# Patient Record
Sex: Male | Born: 1985 | Race: White | Hispanic: No | Marital: Single | Smoking: Current every day smoker
Health system: Southern US, Community
[De-identification: ages and names within clinical notes are randomized; demographics above are authoritative.]

## PROBLEM LIST (undated history)

## (undated) HISTORY — PX: COLONOSCOPY: SHX174

## (undated) HISTORY — PX: UPPER GASTROINTESTINAL ENDOSCOPY: SHX188

## (undated) HISTORY — PX: BACK SURGERY: SHX140

---
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
## 2007-08-10 ENCOUNTER — Emergency Department (HOSPITAL_COMMUNITY): Admission: EM | Admit: 2007-08-10 | Discharge: 2007-08-10 | Payer: Self-pay | Admitting: Family Medicine

## 2011-02-20 NOTE — Op Note (Signed)
NAME:  Steven Fuentes, HACKENBERG NO.:  [id number]   MEDICAL RECORD NO.:  [id number]                   PATIENT TYPE:  OIB   LOCATION:  2895                                 FACILITY:  [facility]   PHYSICIAN:  Hewitt Shorts, M.D.            DATE OF BIRTH:  1986-01-25   DATE OF PROCEDURE:  03/26/2004  DATE OF DISCHARGE:                                 OPERATIVE REPORT   PREOPERATIVE DIAGNOSIS:  Left L5-S1 lumbar disk herniation, lumbar  degenerative disk disease, lumbar spondylosis and lumbar radiculopathy.   POSTOPERATIVE DIAGNOSIS:  Left L5-S1 lumbar disk herniation, lumbar  degenerative disk disease, lumbar spondylosis and lumbar radiculopathy.   OPERATION PERFORMED:  Left L5-S1 lumbar laminotomy and microdiskectomy with  microdissection.   SURGEON:  Hewitt Shorts, M.D.   ASSISTANT:  Danae Orleans. Venetia Maxon, M.D.   ANESTHESIA:  General endotracheal.   INDICATIONS FOR PROCEDURE:  The patient is an 25 year old man who injured  his back while lifting weights.  Suffered a left L5-S1 disk herniation.  There was underlying degenerative disk disease and spondylosis.  A decision  was made to proceed with elective laminotomy and microdiskectomy.   DESCRIPTION OF PROCEDURE:  The patient was brought to the operating room and  placed under general endotracheal anesthesia.  The patient was turned to a  prone position.  Lumbar region was prepped with Betadine soap and solution  and draped in sterile fashion.  The midline was infiltrated with local  anesthetic with epinephrine.  X-ray was taken to localize the L5-S1 level  and then a midline incision was made over the L5-S1 level and carried down  to the subcutaneous tissue.  Bipolar cautery and electrocautery were used to  maintain hemostasis.  Dissection was carried down to the lumbar fascia which  was incised on the left side of the midline in the paraspinal muscles.  We  dissected the spinous process and lamina in  subperiosteal fashion.  The L5-  S1 interlaminar space was identified and x-ray was taken to confirm the  localization and then the operating microscope was draped and brought into  the field to provide additional magnification, illumination and  visualization and the remainder of the decompression was performed using  microdissection and microsurgical technique. A laminotomy was performed  using the [facility] Max drill.  Edges of bone were waxed with bone wax.  The  ligamentum flavum was carefully resected and we identified the thecal sac  and left S1 nerve root.  These structures were gently retracted medially.  Epidural veins were coagulated and divided as necessary.  We were able to  identify the disk herniation.  The remaining overlying annular fibers were  incised and disk herniation removed.  Anterior to the disk space we removed  additional degenerative disk material and in the end, all loose fragments of  disk material were removed from both the disk space and the epidural space  and good  decompression of the thecal sac and nerve root was achieved.  Once  the decompression was completed, hemostasis was established with the use of  bipolar cautery as well as Gelfoam soaked in thrombin.  All the Gelfoam was  removed prior to closure.  The wound was irrigated with bacitracin solution  and checked once again for hemostasis, then we instilled 2 mL of fentanyl  and 80 mg of Depo-Medrol into the epidural space and proceeded with closure.  The deep fascia was closed with #1 undyed Vicryl sutures, the subcutaneous  and subcuticular layer were closed with interrupted inverted 2-0 undyed  Vicryl sutures and skin edges closed with  Dermabond.  The patient tolerated the procedure well.  The estimated blood  loss for this procedure was 50 mL.  Sponge, needle and instrument counts  were correct.  Following surgery the patient was turned back to supine  position to be reversed from anesthetic,  extubated and transferred to  recovery room for further care.                                               Hewitt Shorts, M.D.    RWN/MEDQ  D:  03/26/2004  T:  03/27/2004  Job:  [id number]

## 2011-07-14 LAB — POCT RAPID STREP A: Streptococcus, Group A Screen (Direct): NEGATIVE

## 2015-05-22 ENCOUNTER — Emergency Department (HOSPITAL_COMMUNITY)
Admission: EM | Admit: 2015-05-22 | Discharge: 2015-05-22 | Disposition: A | Discharge status: Home / Self Care | Payer: Worker's Compensation | Attending: Emergency Medicine | Admitting: Emergency Medicine

## 2015-05-22 ENCOUNTER — Encounter (HOSPITAL_COMMUNITY): Payer: Self-pay | Admitting: *Deleted

## 2015-05-22 DIAGNOSIS — Y9389 Activity, other specified: Secondary | ICD-10-CM | POA: Diagnosis not present

## 2015-05-22 DIAGNOSIS — Y9289 Other specified places as the place of occurrence of the external cause: Secondary | ICD-10-CM | POA: Diagnosis not present

## 2015-05-22 DIAGNOSIS — W868XXA Exposure to other electric current, initial encounter: Secondary | ICD-10-CM | POA: Insufficient documentation

## 2015-05-22 DIAGNOSIS — Y990 Civilian activity done for income or pay: Secondary | ICD-10-CM | POA: Diagnosis not present

## 2015-05-22 DIAGNOSIS — T754XXA Electrocution, initial encounter: Secondary | ICD-10-CM | POA: Diagnosis not present

## 2015-05-22 DIAGNOSIS — Z720 Tobacco use: Secondary | ICD-10-CM | POA: Insufficient documentation

## 2015-05-22 DIAGNOSIS — T148 Other injury of unspecified body region: Secondary | ICD-10-CM | POA: Insufficient documentation

## 2015-05-22 LAB — CBC WITH DIFFERENTIAL/PLATELET
Basophils Absolute: 0 10*3/uL (ref 0.0–0.1)
Basophils Relative: 0 % (ref 0–1)
EOS PCT: 1 % (ref 0–5)
Eosinophils Absolute: 0.1 10*3/uL (ref 0.0–0.7)
HCT: 44.5 % (ref 39.0–52.0)
Hemoglobin: 15.3 g/dL (ref 13.0–17.0)
LYMPHS ABS: 3.1 10*3/uL (ref 0.7–4.0)
LYMPHS PCT: 31 % (ref 12–46)
MCH: 29.9 pg (ref 26.0–34.0)
MCHC: 34.4 g/dL (ref 30.0–36.0)
MCV: 86.9 fL (ref 78.0–100.0)
MONO ABS: 0.7 10*3/uL (ref 0.1–1.0)
MONOS PCT: 7 % (ref 3–12)
Neutro Abs: 6.1 10*3/uL (ref 1.7–7.7)
Neutrophils Relative %: 61 % (ref 43–77)
PLATELETS: 217 10*3/uL (ref 150–400)
RBC: 5.12 MIL/uL (ref 4.22–5.81)
RDW: 12.2 % (ref 11.5–15.5)
WBC: 10 10*3/uL (ref 4.0–10.5)

## 2015-05-22 LAB — CK: Total CK: 205 U/L (ref 49–397)

## 2015-05-22 LAB — CK TOTAL AND CKMB (NOT AT ARMC)
CK, MB: 7 ng/mL — ABNORMAL HIGH (ref 0.5–5.0)
Relative Index: 3.5 — ABNORMAL HIGH (ref 0.0–2.5)
Total CK: 200 U/L (ref 49–397)

## 2015-05-22 LAB — BASIC METABOLIC PANEL
Anion gap: 9 (ref 5–15)
BUN: 14 mg/dL (ref 6–20)
CHLORIDE: 108 mmol/L (ref 101–111)
CO2: 23 mmol/L (ref 22–32)
Calcium: 9.3 mg/dL (ref 8.9–10.3)
Creatinine, Ser: 1.23 mg/dL (ref 0.61–1.24)
GFR calc Af Amer: >60 mL/min (ref >60)
GFR calc non Af Amer: >60 mL/min (ref >60)
GLUCOSE: 111 mg/dL — AB (ref 65–99)
POTASSIUM: 3.7 mmol/L (ref 3.5–5.1)
Sodium: 140 mmol/L (ref 135–145)

## 2015-05-22 MED ORDER — CYCLOBENZAPRINE HCL 10 MG PO TABS
ORAL_TABLET | ORAL | Status: AC
  Filled 2015-05-22: qty 1

## 2015-05-22 MED ORDER — HYDROCODONE-ACETAMINOPHEN 5-325 MG PO TABS: 1.0000 | ORAL_TABLET | ORAL | Status: DC | PRN

## 2015-05-22 MED ORDER — CYCLOBENZAPRINE HCL 10 MG PO TABS
10.0000 mg | ORAL_TABLET | Freq: Once | ORAL | Status: AC
  Administered 2015-05-22: 10 mg via ORAL

## 2015-05-22 MED ORDER — HYDROCODONE-ACETAMINOPHEN 5-325 MG PO TABS
1.0000 | ORAL_TABLET | Freq: Once | ORAL | Status: AC
  Administered 2015-05-22: 1 via ORAL

## 2015-05-22 MED ORDER — HYDROCODONE-ACETAMINOPHEN 5-325 MG PO TABS
ORAL_TABLET | ORAL | Status: AC
  Filled 2015-05-22: qty 2

## 2015-05-22 MED ORDER — CYCLOBENZAPRINE HCL 10 MG PO TABS: 10.0000 mg | ORAL_TABLET | Freq: Three times a day (TID) | ORAL | Status: DC | PRN
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
## 2015-05-22 NOTE — Discharge Instructions (Signed)
Electric Shock Injury Electric shock injuries may be caused by lightning or electricity (current) passing through the body. The amount of injury depends on the current's pressure (voltage), the amount of current (amperage), the type of current (direct vs. alternating), the body's resistance to the current, the current's path through the body, and how long the body remains in contact with the current. Current is the flow of electricity. Electricity may produce effects ranging from barely noticeable tingling to instant death; every part of the body is vulnerable.  The harshness of injury depends mostly on the voltage. Low voltage can be as dangerous as high voltage under the right circumstances. People have been killed by shocks of just 50 volts. WHAT DETERMINES THE EFFECTS OF ELECTRICITY? How electric shocks affect the skin is determined by the skin's resistance. This is the skin's ability to stay unharmed by a shock. This, in turn, depends upon the wetness, dryness, thickness and or cleanliness of the skin. Thin or wet skin is much less resistant than thick or dry skin. When skin resistance is low, the current may cause little or no skin damage but may severely burn internal organs and tissues. Conversely, high skin resistance, such as with dry thick skin, can produce severe skin burns but decreases the current entering the body. WHAT PARTS OF THE BODY DOES ELECTRICITY AFFECT THE MOST?  The nervous system (the brain, spinal cord, and nerves) are most helpless to the effects of electricity and most often harmed in electrical injury. Some damage is minor and clears up on its own or with treatment. Sometimes the damage is severe and will be permanent. Neurological problems may be apparent immediately after the accident, or gradually develop over a period of up to three years.  Damage to the respiratory and cardiovascular systems happens immediately. Electric shocks can paralyze the respiratory system (stop  breathing) or disrupt heart action (cause the heart to beat irregularly or stop). This may cause instant death. Smaller veins and arteries, which get hot more easily than the larger blood vessels, are at greater risk. They can develop blood clots. Damage to the smaller vessels is a common cause of amputation following high-voltage injuries.  Other injuries may include cataracts, kidney failure, and injury to muscle tissue. An electric arc may set clothing and flammable substances on fire which may cause burns. Strong shocks are often accompanied by violent muscle spasms that can break and dislocate bones. These spasms can also freeze the victim in place and prevent him or her from breaking away from the current. DIAGNOSIS  Diagnosis relies on information about the cause of the accident, physical examination, and close monitoring of the heart, lungs, neurological condition and kidney activity. These conditions can change rapidly so close observation is necessary. Magnetic resonance imaging (MRI) may be necessary to check for brain injury. TREATMENT   When an electrical accident happens at home or in the workplace, emergency medical help should be summoned as quickly as possible. The main power should immediately be shut off. If that cannot be done, and current is still flowing through the victim, stand on a dry, non-conducting surface such as a folded newspaper, flattened cardboard carton, or plastic or rubber mat. Use a non-conducting object such as a wooden broomstick (never a damp or metallic object) to push the victim away from the source of the current. Non-conducting means the substance will not pass electricity easily through it. Do not touch the victim or electrical source while the current is still flowing. This  may electrocute the rescuer.  If the victim is faint, pale, or showing signs of shock, lay the victim down, with the legs elevated above the level of the chest. Warm the person with a  blanket.  If a pulse can not be felt, or the person is not breathing, someone trained in cardiopulmonary resuscitation (CPR) should begin CPR. Continue this until help arrives.  If the victim is burned, remove clothing that comes off easily. Rinse the burned area in cool water for pain relief. Give first aid for burns. Burns often require treatment at a burn center.  Electrical injury can be associated with explosions or falls that can cause other injuries. Avoid moving the head or neck if an injury to this area is suspected.  Give first aid as needed for other wounds or fractures.  Fluid replacement therapy is necessary to restore lost fluids and electrolytes. Severely injured tissue is repaired surgically.  Antibiotics and antibacterial creams are used to prevent infection.  Kidney failure may need to be treated.  Physical therapy may help recovery along with counseling if there is disfigurement. PROGNOSIS   Electric shocks may cause death.  Survivors may require amputation. Cosmetic problems may result along with disfigurement.  Injuries from household appliances and other low-voltage sources are less likely to produce extreme damage. PREVENTION   Know electrical dangers in your home.  Damaged electric appliances, wiring, cords, and plugs should be repaired or replaced. Electrical repairs should be attempted only by people with the proper training.  Hair dryers, radios, and other electric appliances should never be used in the bathroom or anywhere else they might accidentally come in contact with water. Water and pipes create a ground and the electricity picks the easiest way to go to ground which can be through your body.  Young children need to be kept away from electric appliances and should be taught about the dangers of electricity as soon as they are old enough.  Electric outlets require safety covers in homes with young children.  During lightning and thunder storms, go  indoors immediately, even if no rain is falling. Boaters should return to shore as rapidly as possible.  If the hair on your head or arms stands on end during a storm, seek cover as rapidly as possible as a lightning strike may be about to happen.  If you cannot reach indoor shelter, stay away from metallic objects such as golf clubs or fishing rods and lie down in low-ground areas. Standing or lying under or next to tall or metallic structures is unsafe. For example, it is unsafe to stand under a tree during a lightning storm. Do not stand next to long conductors of electricity such as wire fences.  An automobile is appropriate cover, as long as the radio is off.  Telephones, computers, hair dryers, and other appliances that can act as channels for lightning should not be used during a thunder storm.  During storms, stay away from screens and metal that may conduct electricity from the outside. SEEK IMMEDIATE MEDICAL CARE IF:  You develop chest pain.  A part of your arms or legs becomes very swollen or painful.  One of your arms or legs appears pale, cool, or discolored.  Your urine becomes discolored, or you are not urinating as much as usual.  You develop severe abdominal pain. Document Released: 09/24/2003 Document Revised: 12/14/2011 Document Reviewed: 12/18/2013 [facility] Patient Information 2015 [place], [place]. This information is not intended to replace advice given to you by  your health care provider. Make sure you discuss any questions you have with your health care provider. ° °

## 2016-12-27 ENCOUNTER — Emergency Department (HOSPITAL_COMMUNITY): Payer: Self-pay

## 2016-12-27 ENCOUNTER — Emergency Department (HOSPITAL_COMMUNITY)
Admission: EM | Admit: 2016-12-27 | Discharge: 2016-12-27 | Disposition: A | Discharge status: Home / Self Care | Payer: Self-pay | Attending: Emergency Medicine | Admitting: Emergency Medicine

## 2016-12-27 ENCOUNTER — Encounter (HOSPITAL_COMMUNITY): Payer: Self-pay | Admitting: Emergency Medicine

## 2016-12-27 DIAGNOSIS — Y999 Unspecified external cause status: Secondary | ICD-10-CM | POA: Insufficient documentation

## 2016-12-27 DIAGNOSIS — Y929 Unspecified place or not applicable: Secondary | ICD-10-CM | POA: Insufficient documentation

## 2016-12-27 DIAGNOSIS — S0993XA Unspecified injury of face, initial encounter: Secondary | ICD-10-CM | POA: Insufficient documentation

## 2016-12-27 DIAGNOSIS — S0992XA Unspecified injury of nose, initial encounter: Secondary | ICD-10-CM | POA: Insufficient documentation

## 2016-12-27 DIAGNOSIS — S0990XA Unspecified injury of head, initial encounter: Secondary | ICD-10-CM

## 2016-12-27 DIAGNOSIS — F17210 Nicotine dependence, cigarettes, uncomplicated: Secondary | ICD-10-CM | POA: Insufficient documentation

## 2016-12-27 DIAGNOSIS — Y939 Activity, unspecified: Secondary | ICD-10-CM | POA: Insufficient documentation

## 2016-12-27 MED ORDER — IBUPROFEN 600 MG PO TABS: 600.0000 mg | ORAL_TABLET | Freq: Four times a day (QID) | ORAL | 0 refills | Status: DC | PRN

## 2016-12-27 MED ORDER — HYDROCODONE-ACETAMINOPHEN 5-325 MG PO TABS
1.0000 | ORAL_TABLET | Freq: Once | ORAL | Status: AC
  Administered 2016-12-27: 1 via ORAL
  Filled 2016-12-27: qty 1

## 2016-12-27 NOTE — ED Notes (Signed)
Pt refusing BP cuff. Made aware that we will need to take vitals every 2 hours.
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
## 2016-12-27 NOTE — ED Notes (Signed)
Pt understood dc material. NAD noted. Scripts given at dc 

## 2016-12-27 NOTE — Discharge Instructions (Addendum)
There were no signs of fracture on the nasal xray.  Head injury: You have sustained a head injury and may have sustained a concussion. Please refer to the attached documents for information regarding expected symptoms and symptoms that would require you to return to the ED. Pain: May take ibuprofen or naproxen to reduce pain and inflammation. Take these types of medications with food to avoid upset stomach. Choose one of these medications, but not both.  Ice: May apply ice to the area over the next 24 hours for 15 minutes at a time to reduce swelling. Nosebleed: Refer to the attached document for more information. Follow up: Follow up with a primary care provider for any future management.
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
## 2019-05-27 ENCOUNTER — Encounter (HOSPITAL_COMMUNITY): Payer: Self-pay | Admitting: Emergency Medicine

## 2019-05-27 ENCOUNTER — Other Ambulatory Visit: Payer: Self-pay

## 2019-05-27 ENCOUNTER — Emergency Department (HOSPITAL_COMMUNITY)
Admission: EM | Admit: 2019-05-27 | Discharge: 2019-05-28 | Disposition: A | Discharge status: Home / Self Care | Payer: No Typology Code available for payment source | Attending: Emergency Medicine | Admitting: Emergency Medicine

## 2019-05-27 ENCOUNTER — Emergency Department (HOSPITAL_COMMUNITY): Payer: No Typology Code available for payment source

## 2019-05-27 DIAGNOSIS — Y929 Unspecified place or not applicable: Secondary | ICD-10-CM | POA: Diagnosis not present

## 2019-05-27 DIAGNOSIS — Y939 Activity, unspecified: Secondary | ICD-10-CM | POA: Diagnosis not present

## 2019-05-27 DIAGNOSIS — X500XXA Overexertion from strenuous movement or load, initial encounter: Secondary | ICD-10-CM | POA: Diagnosis not present

## 2019-05-27 DIAGNOSIS — Y9301 Activity, walking, marching and hiking: Secondary | ICD-10-CM | POA: Insufficient documentation

## 2019-05-27 DIAGNOSIS — S99911A Unspecified injury of right ankle, initial encounter: Secondary | ICD-10-CM | POA: Diagnosis present

## 2019-05-27 DIAGNOSIS — Y999 Unspecified external cause status: Secondary | ICD-10-CM | POA: Insufficient documentation

## 2019-05-27 DIAGNOSIS — S93401A Sprain of unspecified ligament of right ankle, initial encounter: Secondary | ICD-10-CM | POA: Insufficient documentation

## 2019-05-27 NOTE — ED Triage Notes (Signed)
Pt reported he fell while walking down stairs carrying glasses. Pt reported right foot bent inward, ankle now swollen. Also reported hearing popping noise.

## 2019-05-27 NOTE — ED Notes (Signed)
ED Provider at bedside. 

## 2019-05-27 NOTE — ED Provider Notes (Signed)
[facility] EMERGENCY DEPARTMENT Provider Note   CSN: [id number] Arrival date & time: 05/27/19  2324     History   Chief Complaint Chief Complaint  Patient presents with  . Ankle Pain  . Fall    HPI Steven Fuentes is a 33 y.o. male.     The history is provided by the patient. No language interpreter was used.  Ankle Pain Location:  Ankle Time since incident:  2 hours Injury: yes   Mechanism of injury comment:  Rolled ankle while walking downstairs Ankle location:  R ankle Pain details:    Severity:  Moderate   Onset quality:  Sudden   Timing:  Constant   Progression:  Unchanged Chronicity:  New Foreign body present:  No foreign bodies Prior injury to area:  Yes (prior strain/sprain) Relieved by:  Nothing Worsened by:  Bearing weight Ineffective treatments:  Rest Associated symptoms: swelling   Associated symptoms: no muscle weakness, no numbness and no tingling   Risk factors: no known bone disorder   Fall    History reviewed. No pertinent past medical history.  There are no active problems to display for this patient.   Past Surgical History:  Procedure Laterality Date  . BACK SURGERY         Home Medications    Prior to Admission medications   Medication Sig Start Date End Date Taking? Authorizing Provider  naproxen (NAPROSYN) 500 MG tablet Take 1 tablet (500 mg total) by mouth every 12 (twelve) hours as needed for mild pain or moderate pain. 05/28/19   Antonietta Breach, PA-C    Family History No family history on file.  Social History Social History   Tobacco Use  . Smoking status: Current Every Day Smoker    Types: Cigarettes  Substance Use Topics  . Alcohol use: No  . Drug use: No     Allergies   Patient has no known allergies.   Review of Systems Review of Systems Ten systems reviewed and are negative for acute change, except as noted in the HPI.    Physical Exam Updated Vital Signs BP 121/90 (BP Location:  Left Arm)   Pulse 91   Temp 98.4 F (36.9 C) (Oral)   Resp 19   SpO2 100%   Physical Exam Vitals signs and nursing note reviewed.  Constitutional:      General: He is not in acute distress.    Appearance: He is well-developed. He is not diaphoretic.     Comments: Nontoxic appearing and in NAD  HENT:     Head: Normocephalic and atraumatic.  Eyes:     General: No scleral icterus.    Conjunctiva/sclera: Conjunctivae normal.  Neck:     Musculoskeletal: Normal range of motion.  Cardiovascular:     Rate and Rhythm: Normal rate and regular rhythm.     Pulses: Normal pulses.     Comments: DP and PT pulse 2+ in the RLE Pulmonary:     Effort: Pulmonary effort is normal. No respiratory distress.  Musculoskeletal:     Comments: Mild soft tissues swelling about the lateral malleolus without crepitus or deformity; TTP noted. Negative Thompson's test.   Skin:    General: Skin is warm and dry.     Coloration: Skin is not pale.     Findings: No erythema or rash.  Neurological:     Mental Status: He is alert and oriented to person, place, and time.     Comments: Sensation to light  touch intact in the RLE. Patient able to wiggle all toes.  Psychiatric:        Behavior: Behavior normal.      ED Treatments / Results  Labs (all labs ordered are listed, but only abnormal results are displayed) Labs Reviewed - No data to display  EKG None  Radiology Dg Ankle Complete Right  Result Date: 05/28/2019 CLINICAL DATA:  Fall while walking down stairs carrying glasses. Right ankle pain and swelling. EXAM: RIGHT ANKLE - COMPLETE 3+ VIEW COMPARISON:  None. FINDINGS: There is no evidence of fracture, dislocation, or joint effusion. There is no evidence of arthropathy or other focal bone abnormality. Mild soft tissue edema. IMPRESSION: Mild soft tissue edema. No acute osseous abnormality. Electronically Signed   By: Keith Rake M.D.   On: 05/28/2019 00:07    Procedures Procedures (including  critical care time)  Medications Ordered in ED Medications - No data to display   Initial Impression / Assessment and Plan / ED Course  I have reviewed the triage vital signs and the nursing notes.  Pertinent labs & imaging results that were available during my care of the patient were reviewed by me and considered in my medical decision making (see chart for details).        Patient presents to the emergency department for evaluation of R ankle pain. Patient neurovascularly intact on exam. Imaging negative for fracture, dislocation, bony deformity. No erythema, heat to touch to the affected area; no concern for septic joint. Compartments in the affected extremity are soft. Plan for supportive management including RICE and NSAIDs; primary care follow up as needed. Return precautions discussed and provided. Patient discharged in stable condition with no unaddressed concerns.   Final Clinical Impressions(s) / ED Diagnoses   Final diagnoses:  Sprain of right ankle, unspecified ligament, initial encounter    ED Discharge Orders         Ordered    naproxen (NAPROSYN) 500 MG tablet  Every 12 hours PRN     05/28/19 0014           Antonietta Breach, PA-C 05/28/19 0023    Veryl Speak, MD 05/28/19 [phone]

## 2019-05-27 NOTE — ED Notes (Signed)
Patient transported to X-ray 

## 2019-05-28 MED ORDER — NAPROXEN 500 MG PO TABS: 500.0000 mg | ORAL_TABLET | Freq: Two times a day (BID) | ORAL | 0 refills | Status: AC | PRN

## 2019-05-28 NOTE — Discharge Instructions (Signed)
Wear a brace for stability.  You may use crutches to prevent from putting weight on your right ankle.  Take naproxen as prescribed for management of pain.  You may ice your ankle 3-4 times per day for 15 to 20 minutes each time to limit swelling/inflammation.  Follow-up with orthopedics as needed for persistent symptoms/pain.

## 2019-05-28 NOTE — ED Notes (Signed)
Pt verbalized understanding of discharge instructions and scripts. repeatbacked how to apply splint and use crutches.

## 2020-04-03 ENCOUNTER — Ambulatory Visit: Payer: No Typology Code available for payment source | Admitting: Gastroenterology

## 2020-04-05 ENCOUNTER — Telehealth: Payer: Self-pay | Admitting: Physician Assistant

## 2020-04-05 NOTE — Telephone Encounter (Signed)
Spoke with patient, pt reports constant pressure of abdomen and stomach, pt states that this has been going on at least 4-5 months. Anything he eats makes his stomach upset, stools are oily, vomiting and diarrhea at the same time before , dairy products give him issues , flare up after [employer] he states that they were in some bad environments and eating some bad stuff. Pt advised that since he would be a new patient here that the best thing for him to do is going to an urgent care for evaluation. I advised patient that the urgent care may go ahead and run some tests that he would not have to wait to have done until his appointment and that would also give [place] more information on how we can further help him. Pt states that he is going to try and hold off going anywhere. Pt advised that if he is in severe pain, has black tarry stools, or any bleeding he needs to go to the ED.

## 2020-05-06 ENCOUNTER — Ambulatory Visit: Payer: No Typology Code available for payment source | Admitting: Physician Assistant

## 2020-05-07 ENCOUNTER — Encounter: Payer: Self-pay | Admitting: Gastroenterology

## 2020-05-07 ENCOUNTER — Ambulatory Visit (INDEPENDENT_AMBULATORY_CARE_PROVIDER_SITE_OTHER): Payer: No Typology Code available for payment source | Admitting: Gastroenterology

## 2020-05-07 ENCOUNTER — Other Ambulatory Visit (INDEPENDENT_AMBULATORY_CARE_PROVIDER_SITE_OTHER): Payer: No Typology Code available for payment source

## 2020-05-07 VITALS — BP 124/80 | HR 76 | Ht 76.0 in | Wt 267.0 lb

## 2020-05-07 DIAGNOSIS — R1084 Generalized abdominal pain: Secondary | ICD-10-CM

## 2020-05-07 DIAGNOSIS — K529 Noninfective gastroenteritis and colitis, unspecified: Secondary | ICD-10-CM

## 2020-05-07 LAB — CBC WITH DIFFERENTIAL/PLATELET
Basophils Absolute: 0.1 10*3/uL (ref 0.0–0.1)
Basophils Relative: 0.6 % (ref 0.0–3.0)
Eosinophils Absolute: 0.2 10*3/uL (ref 0.0–0.7)
Eosinophils Relative: 2 % (ref 0.0–5.0)
HCT: 46.5 % (ref 39.0–52.0)
Hemoglobin: 15.9 g/dL (ref 13.0–17.0)
Lymphocytes Relative: 21.4 % (ref 12.0–46.0)
Lymphs Abs: 2.1 10*3/uL (ref 0.7–4.0)
MCHC: 34.2 g/dL (ref 30.0–36.0)
MCV: 88.3 fl (ref 78.0–100.0)
Monocytes Absolute: 0.7 10*3/uL (ref 0.1–1.0)
Monocytes Relative: 7.2 % (ref 3.0–12.0)
Neutro Abs: 6.9 10*3/uL (ref 1.4–7.7)
Neutrophils Relative %: 68.8 % (ref 43.0–77.0)
Platelets: 187 10*3/uL (ref 150.0–400.0)
RBC: 5.26 Mil/uL (ref 4.22–5.81)
RDW: 13 % (ref 11.5–15.5)
WBC: 10 10*3/uL (ref 4.0–10.5)

## 2020-05-07 LAB — COMPREHENSIVE METABOLIC PANEL
ALT: 32 U/L (ref 0–53)
AST: 19 U/L (ref 0–37)
Albumin: 4.6 g/dL (ref 3.5–5.2)
Alkaline Phosphatase: 94 U/L (ref 39–117)
BUN: 10 mg/dL (ref 6–23)
CO2: 30 mEq/L (ref 19–32)
Calcium: 9.7 mg/dL (ref 8.4–10.5)
Chloride: 106 mEq/L (ref 96–112)
Creatinine, Ser: 1.15 mg/dL (ref 0.40–1.50)
GFR: 72.55 mL/min (ref >60.00)
Glucose, Bld: 97 mg/dL (ref 70–99)
Potassium: 4.4 mEq/L (ref 3.5–5.1)
Sodium: 139 mEq/L (ref 135–145)
Total Bilirubin: 0.8 mg/dL (ref 0.2–1.2)
Total Protein: 7.2 g/dL (ref 6.0–8.3)

## 2020-05-07 NOTE — Progress Notes (Signed)
[facility] Consult Note:  History: Steven Fuentes 05/07/2020  Referring provider: [facility], [facility]  Reason for consult/chief complaint: Diarrhea (change in bowel habits, has concerns about him serving in [employer] ) and Abdominal Pain (worsens with milk products but not cheese  Has tried lactade tablets that has not helped)   Subjective  HPI: [facility] indicates chronic lower abdominal pain and bloating, worse with certain foods, hard stool followed by liquid, BM every other day.  Patient tried probiotic without improvement. No additional clinical information, notes, imaging or lab results accompany this referral.  Jenkins is a 34 year old veteran referred to [place] by the local [facility] for abdominal pain and altered bowel habits.  He reports years of the symptoms since returning from service in [place], but it has been steadily worsening in the last 1 to 2 years.  He has generalized abdominal pain that is typically about a 3 out of 10 in a bloating dull discomfort, however at times is a 10 out of 10 sharp pain like a knife.  His stools are typically semiformed to loose and sometimes explosive with urgency.  For whatever reason, his symptoms are triggered by milk shakes, which had never bothered him before.  However, he can eat cheese with no problem.  Sometimes he has a regular formed stool but this is pretty rare.  He has not seen any bleeding, his appetite is generally good and weight stable.  He denies nausea vomiting, early satiety dysphagia or odynophagia.  He is frustrated by these chronic symptoms, says it makes it difficult for him to work his job at [employer], and equally frustrated that he perceives little or nothing is been done by the [place].  I have no records of any testing done for him, but he seems to recall some stool tests and perhaps some lab work last year.  He harbors concerns that he could have some kind of chronic  infection since his father apparently had 18 years of similar symptoms before his diagnosis was made and treatment given.  ROS:  Review of Systems  Constitutional: Negative for appetite change and unexpected weight change.  HENT: Negative for mouth sores and voice change.   Eyes: Negative for pain and redness.  Respiratory: Negative for cough and shortness of breath.   Cardiovascular: Negative for chest pain and palpitations.  Genitourinary: Negative for dysuria and hematuria.  Musculoskeletal: Negative for arthralgias and myalgias.  Skin: Negative for pallor and rash.  Neurological: Negative for weakness and headaches.  Hematological: Negative for adenopathy.     Past Medical History: History reviewed. No pertinent past medical history. He does not have any chronic medical problems described. He is disabled from back injury requiring several surgeries while in [employer].  Past Surgical History: Past Surgical History:  Procedure Laterality Date  . BACK SURGERY       Family History: Family History  Problem Relation Age of Onset  . Skin cancer Maternal Grandfather   . Lung cancer Paternal Grandfather     Social History: Social History   Socioeconomic History  . Marital status: Single    Spouse name: Not on file  . Number of children: Not on file  . Years of education: Not on file  . Highest education level: Not on file  Occupational History  . Not on file  Tobacco Use  . Smoking status: Current Every Day Smoker    Types: Cigarettes  . Smokeless tobacco: Never Used  Substance and Sexual Activity  . Alcohol use: No  . Drug use: No  . Sexual activity: Not on file  Other Topics Concern  . Not on file  Social History Narrative  . Not on file   Social Determinants of Health   Financial Resource Strain:   . Difficulty of Paying Living Expenses:   Food Insecurity:   . Worried About Charity fundraiser in the Last Year:   . Arboriculturist in the Last  Year:   Transportation Needs:   . Film/video editor (Medical):   Marland Kitchen Lack of Transportation (Non-Medical):   Physical Activity:   . Days of Exercise per Week:   . Minutes of Exercise per Session:   Stress:   . Feeling of Stress :   Social Connections:   . Frequency of Communication with Friends and Family:   . Frequency of Social Gatherings with Friends and Family:   . Attends Religious Services:   . Active Member of Clubs or Organizations:   . Attends Archivist Meetings:   Marland Kitchen Marital Status:     Allergies: No Known Allergies  Outpatient Meds: Current Outpatient Medications  Medication Sig Dispense Refill  . bismuth subsalicylate (PEPTO BISMOL) 262 MG/15ML suspension Take 30 mLs by mouth every 6 (six) hours as needed.    . naproxen (NAPROSYN) 500 MG tablet Take 1 tablet (500 mg total) by mouth every 12 (twelve) hours as needed for mild pain or moderate pain. 30 tablet 0   No current facility-administered medications for this visit.      ___________________________________________________________________ Objective   Exam:  BP 124/80 (BP Location: Left Arm, Patient Position: Sitting)   Pulse 76   Ht 6\' 4"  (1.93 m)   Wt 267 lb (121.1 kg)   SpO2 99%   BMI 32.50 kg/m    General: He is not acutely ill-appearing.  Gruff demeanor.  Eyes: sclera anicteric, no redness  ENT: oral mucosa moist without lesions, no cervical or supraclavicular lymphadenopathy  CV: RRR without murmur, S1/S2, no JVD, no peripheral edema  Resp: clear to auscultation bilaterally, normal RR and effort noted  GI: soft, scattered tenderness, with active bowel sounds. No guarding or palpable organomegaly noted.  Skin; warm and dry, no rash or jaundice noted  Neuro: awake, alert and oriented x 3. Normal gross motor function and fluent speech  No data for review  Assessment: Encounter Diagnoses  Name Primary?  . Generalized abdominal pain Yes  . Chronic diarrhea     Years of  generalized abdominal pain with chronic diarrhea, difficult to characterize.  Unknown prior work-up, but sounds like it was not extensive. He seems fixated on the probability of some chronic infection.  If so, would most likely be parasite.  Even if previous testing for this done, given the persistence and severity of symptoms, repeat testing warranted.  Plan:  CBC and CMP today. Stool for C. difficile toxin and ova and parasites.  EGD and colonoscopy.  Biopsy small bowel for celiac sprue, evaluate entire colon to terminal ileum if possible, biopsy for microscopic colitis.  The benefits and risks of the planned procedure were described in detail with the patient or (when appropriate) their health care proxy.  Risks were outlined as including, but not limited to, bleeding, infection, perforation, adverse medication reaction leading to cardiac or pulmonary decompensation, pancreatitis (if ERCP).  The limitation of incomplete mucosal visualization was also discussed.  No guarantees or warranties were given.  Has not had  and does not plan to get a Covid vaccine, so arrangements were made for testing 2 days prior.  No prescription or other treatment given today, pending clearer diagnosis after some testing.  Thank you for the courtesy of this consult.  Please call me with any questions or concerns.  Nelida Meuse III  CC: Referring provider noted above

## 2020-05-07 NOTE — Patient Instructions (Addendum)
If you are age 34 or older, your body mass index should be between 23-30. Your Body mass index is 32.5 kg/m. If this is out of the aforementioned range listed, please consider follow up with your Primary Care Provider.  If you are age 36 or younger, your body mass index should be between 19-25. Your Body mass index is 32.5 kg/m. If this is out of the aformentioned range listed, please consider follow up with your Primary Care Provider.   You have been scheduled for an endoscopy and colonoscopy. Please follow the written instructions given to you at your visit today. Please pick up your prep supplies at the pharmacy within the next 1-3 days. If you use inhalers (even only as needed), please bring them with you on the day of your procedure.  Your provider has requested that you go to the basement level for lab work before leaving today. Press "B" on the elevator. The lab is located at the first door on the left as you exit the elevator.  Due to recent changes in healthcare laws, you may see the results of your imaging and laboratory studies on MyChart before your provider has had a chance to review them.  We understand that in some cases there may be results that are confusing or concerning to you. Not all laboratory results come back in the same time frame and the provider may be waiting for multiple results in order to interpret others.  Please give [place] 48 hours in order for your provider to thoroughly review all the results before contacting the office for clarification of your results.    It was a pleasure to see you today!  Dr. Loletha Carrow

## 2020-05-10 ENCOUNTER — Other Ambulatory Visit: Payer: No Typology Code available for payment source

## 2020-05-10 DIAGNOSIS — K529 Noninfective gastroenteritis and colitis, unspecified: Secondary | ICD-10-CM

## 2020-05-10 DIAGNOSIS — R1084 Generalized abdominal pain: Secondary | ICD-10-CM

## 2020-05-16 LAB — OVA AND PARASITE EXAMINATION
CONCENTRATE RESULT:: NONE SEEN
MICRO NUMBER:: 10796956
SPECIMEN QUALITY:: ADEQUATE
TRICHROME RESULT:: NONE SEEN

## 2020-05-16 LAB — CLOSTRIDIUM DIFFICILE TOXIN B, QUALITATIVE, REAL-TIME PCR: Toxigenic C. Difficile by PCR: NOT DETECTED

## 2020-05-31 ENCOUNTER — Other Ambulatory Visit: Payer: Self-pay | Admitting: Gastroenterology

## 2020-05-31 ENCOUNTER — Ambulatory Visit (INDEPENDENT_AMBULATORY_CARE_PROVIDER_SITE_OTHER): Payer: No Typology Code available for payment source

## 2020-05-31 ENCOUNTER — Other Ambulatory Visit: Payer: Self-pay

## 2020-05-31 DIAGNOSIS — Z1159 Encounter for screening for other viral diseases: Secondary | ICD-10-CM

## 2020-06-01 LAB — SARS CORONAVIRUS 2 (TAT 6-24 HRS): SARS Coronavirus 2: NEGATIVE

## 2020-06-04 ENCOUNTER — Encounter: Payer: Self-pay | Admitting: Gastroenterology

## 2020-06-04 ENCOUNTER — Other Ambulatory Visit: Payer: Self-pay

## 2020-06-04 ENCOUNTER — Ambulatory Visit (AMBULATORY_SURGERY_CENTER): Payer: No Typology Code available for payment source | Admitting: Gastroenterology

## 2020-06-04 VITALS — BP 114/68 | HR 92 | Temp 98.0°F | Resp 15 | Ht 76.0 in | Wt 267.0 lb

## 2020-06-04 DIAGNOSIS — K5730 Diverticulosis of large intestine without perforation or abscess without bleeding: Secondary | ICD-10-CM | POA: Diagnosis not present

## 2020-06-04 DIAGNOSIS — R1084 Generalized abdominal pain: Secondary | ICD-10-CM

## 2020-06-04 DIAGNOSIS — K3189 Other diseases of stomach and duodenum: Secondary | ICD-10-CM | POA: Diagnosis not present

## 2020-06-04 DIAGNOSIS — D122 Benign neoplasm of ascending colon: Secondary | ICD-10-CM | POA: Diagnosis not present

## 2020-06-04 DIAGNOSIS — K529 Noninfective gastroenteritis and colitis, unspecified: Secondary | ICD-10-CM | POA: Diagnosis not present

## 2020-06-04 MED ORDER — SODIUM CHLORIDE 0.9 % IV SOLN
500.0000 mL | Freq: Once | INTRAVENOUS | Status: DC
Start: 2020-06-04 — End: 2020-06-04

## 2020-06-04 MED ORDER — DICYCLOMINE HCL 10 MG PO CAPS: 20.0000 mg | ORAL_CAPSULE | Freq: Three times a day (TID) | ORAL | 1 refills | Status: DC

## 2020-06-04 NOTE — Op Note (Signed)
[facility] Patient Name: Steven Fuentes Procedure Date: 06/04/2020 2:13 PM MRN: [id number] Endoscopist: Mallie Mussel L. Loletha Carrow , MD Age: 34 Referring MD:  Date of Birth: 1985-10-08 Gender: Male Account #: [id number] Procedure:                Upper GI endoscopy Indications:              Generalized abdominal pain, Diarrhea Medicines:                Monitored Anesthesia Care Procedure:                Pre-Anesthesia Assessment:                           - Prior to the procedure, a History and Physical                            was performed, and patient medications and                            allergies were reviewed. The patient's tolerance of                            previous anesthesia was also reviewed. The risks                            and benefits of the procedure and the sedation                            options and risks were discussed with the patient.                            All questions were answered, and informed consent                            was obtained. Prior Anticoagulants: The patient has                            taken no previous anticoagulant or antiplatelet                            agents. ASA Grade Assessment: II - A patient with                            mild systemic disease. After reviewing the risks                            and benefits, the patient was deemed in                            satisfactory condition to undergo the procedure.                           After obtaining informed consent, the endoscope was  passed under direct vision. Throughout the                            procedure, the patient's blood pressure, pulse, and                            oxygen saturations were monitored continuously. The                            Endoscope was introduced through the mouth, and                            advanced to the second part of duodenum. The upper                            GI endoscopy was  accomplished without difficulty.                            The patient tolerated the procedure well. Scope In: Scope Out: Findings:                 The larynx was normal.                           The esophagus was normal.                           The stomach was normal. Biopsies were taken with a                            cold forceps for histology to rule out H. pylori.                            ([place] protocol).                           The cardia and gastric fundus were normal on                            retroflexion.                           The examined duodenum was normal. Biopsies for                            histology were taken with a cold forceps for                            evaluation of celiac disease. Complications:            No immediate complications. Estimated Blood Loss:     Estimated blood loss was minimal. Impression:               - Normal larynx.                           - Normal esophagus.                           -  Normal stomach. Biopsied.                           - Normal examined duodenum. Biopsied. Recommendation:           - Patient has a contact number available for                            emergencies. The signs and symptoms of potential                            delayed complications were discussed with the                            patient. Return to normal activities tomorrow.                            Written discharge instructions were provided to the                            patient.                           - Resume previous diet.                           - Continue present medications.                           - Await pathology results.                           - See the other procedure note for documentation of                            additional recommendations. Valerie Fredin L. Loletha Carrow, MD 06/04/2020 2:54:07 PM This report has been signed electronically.

## 2020-06-04 NOTE — Progress Notes (Signed)
PT taken to PACU. Monitors in place. VSS. Report given to RN. 

## 2020-06-04 NOTE — Op Note (Signed)
[facility] Patient Name: Steven Fuentes Procedure Date: 06/04/2020 2:13 PM MRN: [id number] Endoscopist: Mallie Mussel L. Loletha Carrow , MD Age: 34 Referring MD:  Date of Birth: [birth year] Gender: Male Account #: [id number] Procedure:                Colonoscopy Indications:              Generalized abdominal pain, Chronic diarrhea                            (recent office note with clinical details.                            subsequent CBC, CMP, C diff and O/P testing normal) Medicines:                Monitored Anesthesia Care Procedure:                Pre-Anesthesia Assessment:                           - Prior to the procedure, a History and Physical                            was performed, and patient medications and                            allergies were reviewed. The patient's tolerance of                            previous anesthesia was also reviewed. The risks                            and benefits of the procedure and the sedation                            options and risks were discussed with the patient.                            All questions were answered, and informed consent                            was obtained. Prior Anticoagulants: The patient has                            taken no previous anticoagulant or antiplatelet                            agents. ASA Grade Assessment: II - A patient with                            mild systemic disease. After reviewing the risks                            and benefits, the patient was deemed in  satisfactory condition to undergo the procedure.                           After obtaining informed consent, the colonoscope                            was passed under direct vision. Throughout the                            procedure, the patient's blood pressure, pulse, and                            oxygen saturations were monitored continuously. The                            Colonoscope was  introduced through the anus and                            advanced to the the terminal ileum, with                            identification of the appendiceal orifice and IC                            valve. The colonoscopy was performed without                            difficulty. The patient tolerated the procedure                            well. The quality of the bowel preparation was                            excellent. The terminal ileum, ileocecal valve,                            appendiceal orifice, and rectum were photographed. Scope In: 2:35:10 PM Scope Out: 2:49:09 PM Scope Withdrawal Time: 0 hours 10 minutes 53 seconds  Total Procedure Duration: 0 hours 13 minutes 59 seconds  Findings:                 The perianal and digital rectal examinations were                            normal. Specifically, no fistula seen.                           The terminal ileum appeared normal.                           A diminutive polyp was found in the ascending                            colon. The polyp was flat. The polyp was removed  with a cold biopsy forceps. Resection and retrieval                            were complete.                           A few small-mouthed diverticula were found in the                            sigmoid colon.                           Normal mucosa was found in the entire colon.                            Biopsies for histology were taken with a cold                            forceps from the right colon and left colon for                            evaluation of microscopic colitis.                           The exam was otherwise without abnormality on                            direct and retroflexion views. Complications:            No immediate complications. Estimated Blood Loss:     Estimated blood loss was minimal. Impression:               - The examined portion of the ileum was normal.                            - One diminutive polyp in the ascending colon,                            removed with a cold biopsy forceps. Resected and                            retrieved.                           - Diverticulosis in the sigmoid colon.                           - Normal mucosa in the entire examined colon.                            Biopsied.                           - The examination was otherwise normal on direct  and retroflexion views. Recommendation:           - Patient has a contact number available for                            emergencies. The signs and symptoms of potential                            delayed complications were discussed with the                            patient. Return to normal activities tomorrow.                            Written discharge instructions were provided to the                            patient.                           - Resume previous diet.                           - Continue present medications.                           - Await pathology results.                           - Use Bentyl (dicyclomine) 20 mg PO TID 30 min AC.                            DIsp #90, RF 1                           - Return to my office at appointment to be                            scheduled.                           - Repeat colonoscopy is recommended for                            surveillance. The colonoscopy date will be                            determined after pathology results from today's                            exam become available for review. Cadan Maggart L. Loletha Carrow, MD 06/04/2020 3:01:12 PM This report has been signed electronically.

## 2020-06-04 NOTE — Patient Instructions (Signed)
Read all of the handouts given to you by your recovery room nurse.  Take your new medication as ordered.  You have the prescription printed.  YOU HAD AN ENDOSCOPIC PROCEDURE TODAY AT [facility]:   Refer to the procedure report that was given to you for any specific questions about what was found during the examination.  If the procedure report does not answer your questions, please call your gastroenterologist to clarify.  If you requested that your care partner not be given the details of your procedure findings, then the procedure report has been included in a sealed envelope for you to review at your convenience later.  YOU SHOULD EXPECT: Some feelings of bloating in the abdomen. Passage of more gas than usual.  Walking can help get rid of the air that was put into your GI tract during the procedure and reduce the bloating. If you had a lower endoscopy (such as a colonoscopy or flexible sigmoidoscopy) you may notice spotting of blood in your stool or on the toilet paper. If you underwent a bowel prep for your procedure, you may not have a normal bowel movement for a few days.  Please Note:  You might notice some irritation and congestion in your nose or some drainage.  This is from the oxygen used during your procedure.  There is no need for concern and it should clear up in a day or so.  SYMPTOMS TO REPORT IMMEDIATELY:   Following lower endoscopy (colonoscopy or flexible sigmoidoscopy):  Excessive amounts of blood in the stool  Significant tenderness or worsening of abdominal pains  Swelling of the abdomen that is new, acute  Fever of 100F or higher   Following upper endoscopy (EGD)  Vomiting of blood or coffee ground material  New chest pain or pain under the shoulder blades  Painful or persistently difficult swallowing  New shortness of breath  Fever of 100F or higher  Black, tarry-looking stools  For urgent or emergent issues, a gastroenterologist can be reached  at any hour by calling [phone]. Do not use MyChart messaging for urgent concerns.    DIET:  We do recommend a small meal at first, but then you may proceed to your regular diet.  Drink plenty of fluids but you should avoid alcoholic beverages for 24 hours.  ACTIVITY:  You should plan to take it easy for the rest of today and you should NOT DRIVE or use heavy machinery until tomorrow (because of the sedation medicines used during the test).    FOLLOW UP: Our staff will call the number listed on your records 48-72 hours following your procedure to check on you and address any questions or concerns that you may have regarding the information given to you following your procedure. If we do not reach you, we will leave a message.  We will attempt to reach you two times.  During this call, we will ask if you have developed any symptoms of COVID 19. If you develop any symptoms (ie: fever, flu-like symptoms, shortness of breath, cough etc.) before then, please call [phone].  If you test positive for Covid 19 in the 2 weeks post procedure, please call and report this information to [place].    If any biopsies were taken you will be contacted by phone or by letter within the next 1-3 weeks.  Please call us at [phone] if you have not heard about the biopsies in 3 weeks.    SIGNATURES/CONFIDENTIALITY: You and/or your  care partner have signed paperwork which will be entered into your electronic medical record.  These signatures attest to the fact that that the information above on your After Visit Summary has been reviewed and is understood.  Full responsibility of the confidentiality of this discharge information lies with you and/or your care-partner.

## 2020-06-05 ENCOUNTER — Other Ambulatory Visit: Payer: Self-pay

## 2020-06-05 DIAGNOSIS — R1084 Generalized abdominal pain: Secondary | ICD-10-CM

## 2020-06-05 MED ORDER — DICYCLOMINE HCL 10 MG PO CAPS: 20.0000 mg | ORAL_CAPSULE | Freq: Three times a day (TID) | ORAL | 1 refills | Status: DC

## 2020-06-06 ENCOUNTER — Telehealth: Payer: Self-pay

## 2020-06-06 NOTE — Telephone Encounter (Signed)
  Follow up Call-  Call back number 06/04/2020  Post procedure Call Back phone  # [phone]  Permission to leave phone message No  Some recent data might be hidden     No voicemail

## 2020-06-06 NOTE — Telephone Encounter (Signed)
Pt is doing well.

## 2020-06-11 ENCOUNTER — Encounter: Payer: Self-pay | Admitting: Gastroenterology

## 2020-06-25 ENCOUNTER — Telehealth: Payer: Self-pay | Admitting: Gastroenterology

## 2020-06-25 NOTE — Telephone Encounter (Signed)
Yes, that would be fine.  - HD

## 2020-06-25 NOTE — Telephone Encounter (Signed)
Patient is requesting new Rx to be sent to [facility], will need a 90 days supply. Ok to refill ?

## 2020-06-26 ENCOUNTER — Other Ambulatory Visit: Payer: Self-pay

## 2020-06-26 DIAGNOSIS — R1084 Generalized abdominal pain: Secondary | ICD-10-CM

## 2020-06-26 MED ORDER — DICYCLOMINE HCL 10 MG PO CAPS: 20.0000 mg | ORAL_CAPSULE | Freq: Three times a day (TID) | ORAL | 0 refills | Status: DC

## 2020-06-26 MED ORDER — DICYCLOMINE HCL 10 MG PO CAPS: 20.0000 mg | ORAL_CAPSULE | Freq: Three times a day (TID) | ORAL | 0 refills | Status: AC

## 2020-06-26 NOTE — Telephone Encounter (Signed)
Rx has been sent to [place] at fax number provided

## 2020-06-27 NOTE — Telephone Encounter (Signed)
Have tried to fax rx multiple times. Fax is always buy/no signal. Patient has a voicemail box that is not yet set up to relay message to patient

## 2020-07-01 ENCOUNTER — Telehealth: Payer: Self-pay | Admitting: Gastroenterology

## 2020-07-01 NOTE — Telephone Encounter (Signed)
Rx has been sent to fax provided.This time the fax was completed and went through.

## 2020-07-03 ENCOUNTER — Ambulatory Visit (INDEPENDENT_AMBULATORY_CARE_PROVIDER_SITE_OTHER): Payer: No Typology Code available for payment source | Admitting: Gastroenterology

## 2020-07-03 ENCOUNTER — Encounter: Payer: Self-pay | Admitting: Gastroenterology

## 2020-07-03 VITALS — BP 128/86 | HR 107 | Ht 76.0 in | Wt 272.8 lb

## 2020-07-03 DIAGNOSIS — K582 Mixed irritable bowel syndrome: Secondary | ICD-10-CM | POA: Diagnosis not present

## 2020-07-03 DIAGNOSIS — D122 Benign neoplasm of ascending colon: Secondary | ICD-10-CM | POA: Diagnosis not present

## 2020-07-03 DIAGNOSIS — R140 Abdominal distension (gaseous): Secondary | ICD-10-CM | POA: Diagnosis not present

## 2020-07-03 NOTE — Patient Instructions (Signed)
If you are age 34 or older, your body mass index should be between 23-30. Your Body mass index is 33.21 kg/m. If this is out of the aforementioned range listed, please consider follow up with your Primary Care Provider.  If you are age 47 or younger, your body mass index should be between 19-25. Your Body mass index is 33.21 kg/m. If this is out of the aformentioned range listed, please consider follow up with your Primary Care Provider.   Food Guidelines for those with chronic digestive trouble:  Many people have difficulty digesting certain foods, causing a variety of distressing and embarrassing symptoms such as abdominal pain, bloating and gas.  These foods may need to be avoided or consumed in small amounts.  Here are some tips that might be helpful for you.  1.   Lactose intolerance is the difficulty or complete inability to digest lactose, the natural sugar in milk and anything made from milk.  This condition is harmless, common, and can begin any time during life.  Some people can digest a modest amount of lactose while others cannot tolerate any.  Also, not all dairy products contain equal amounts of lactose.  For example, hard cheeses such as parmesan have less lactose than soft cheeses such as cheddar.  Yogurt has less lactose than milk or cheese.  Many packaged foods (even many brands of bread) have milk, so read ingredient lists carefully.  It is difficult to test for lactose intolerance, so just try avoiding lactose as much as possible for a week and see what happens with your symptoms.  If you seem to be lactose intolerant, the best plan is to avoid it (but make sure you get calcium from another source).  The next best thing is to use lactase enzyme supplements, available over the counter everywhere.  Just know that many lactose intolerant people need to take several tablets with each serving of dairy to avoid symptoms.  Lastly, a lot of restaurant food is made with milk or butter.  Many are  things you might not suspect, such as mashed potatoes, rice and pasta (cooked with butter) and "grilled" items.  If you are lactose intolerant, it never hurts to ask your server what has milk or butter.  2.   Fiber is an important part of your diet, but not all fiber is well-tolerated.  Insoluble fiber such as bran is often consumed by normal gut bacteria and converted into gas.  Soluble fiber such as oats, squash, carrots and green beans are typically tolerated better.  3.   Some types of carbohydrates can be poorly digested.  Examples include: fructose (apples, cherries, pears, raisins and other dried fruits), fructans (onions, zucchini, large amounts of wheat), sorbitol/mannitol/xylitol and sucralose/Splenda (common artificial sweeteners), and raffinose (lentils, broccoli, cabbage, asparagus, brussel sprouts, many types of beans).  Do a Development worker, community for [employer] and you will find helpful information. Beano, a dietary supplement, will often help with raffinose-containing foods.  As with lactase tablets, you may need several per serving.  4.   Whenever possible, avoid processed food&meats and chemical additives.  High fructose corn syrup, a common sweetener, may be difficult to digest.  Eggs and soy (comes from the soybean, and added to many foods now) are other common bloating/gassy foods.  5.  Regarding gluten:  gluten is a protein mainly found in wheat, but also rye and barley.  There is a condition called celiac sprue, which is an inflammatory reaction in the small intestine  causing a variety of digestive symptoms.  Blood testing is highly reliable to look for this condition, and sometimes upper endoscopy with small bowel biopsies may be necessary to make the diagnosis.  Many patients who test negative for celiac sprue report improvement in their digestive symptoms when they switch to a gluten-free diet.  However, in these "non-celiac gluten sensitive" patients, the true role of gluten in their  symptoms is unclear.  Reducing carbohydrates in general may decrease the gas and bloating caused when gut bacteria consume carbs. Also, some of these patients may actually be intolerant of the baker's yeast in bread products rather than the gluten.  Flatbread and other reduced yeast breads might therefore be tolerated.  There is no specific testing available for most food intolerances, which are discovered mainly by dietary elimination.  Please do not embark on a gluten free diet unless directed by your doctor, as it is highly restrictive, and may lead to nutritional deficiencies if not carefully monitored.  Lastly, beware of internet claims offering "personalized" tests for food intolerances.  Such testing has no reliable scientific evidence to support its reliability and correlation to symptoms.    6.  The best advice is old advice, especially for those with chronic digestive trouble - try to eat "clean".  Balanced diet, avoid processed food, plenty of fruits and vegetables, cut down the sugar, minimal alcohol, avoid tobacco. Make time to care for yourself, get enough sleep, exercise when you can, reduce stress.  Your guts will thank you for it.   - Dr. [facility]   It was a pleasure to see you today!  Dr. Loletha Carrow

## 2020-07-03 NOTE — Progress Notes (Signed)
[facility] Progress Note  Chief Complaint: Abdominal pain and diarrhea  Subjective  History: Steven Fuentes was seen in early August for consultation at the request of the [place] regarding chronic abdominal pain and diarrhea. Negative lab and stool study work-up as below. EGD and colonoscopy a month ago unrevealing, biopsies as below. Dicyclomine prescribed shortly afterwards, but he tells me that he was unable to get it from the [place] due to "bureaucracy".  Steven Fuentes is glad to report feeling much better since his recent procedures.  He has made a lot of dietary changes, mostly eliminating fast food and sweets.  He was also glad for the reassurance that no worrisome problems were found.  He is still smoking but plans to work on that when he can.  There is still some intermittent bloating and gas but overall he is much improved.  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  ROS: Cardiovascular:  no chest pain Respiratory: no dyspnea  The patient's Past Medical, Family and Social History were reviewed and are on file in the EMR.  Objective:  Med list reviewed  Current Outpatient Medications:  .  acetaminophen (TYLENOL) 500 MG tablet, Take 500 mg by mouth as needed. , Disp: , Rfl:  .  bismuth subsalicylate (PEPTO BISMOL) 262 MG/15ML suspension, Take 30 mLs by mouth every 6 (six) hours as needed. , Disp: , Rfl:  .  cyclobenzaprine (FLEXERIL) 10 MG tablet, Take 10 mg by mouth as needed. , Disp: , Rfl:  .  naproxen (NAPROSYN) 500 MG tablet, Take 1 tablet (500 mg total) by mouth every 12 (twelve) hours as needed for mild pain or moderate pain., Disp: 30 tablet, Rfl: 0 .  pregabalin (LYRICA) 100 MG capsule, Take 100 mg by mouth as needed. , Disp: , Rfl:  .  dicyclomine (BENTYL) 10 MG capsule, Take 2 capsules (20 mg total) by mouth 3 (three) times daily before meals. (Patient not taking: Reported on 07/03/2020), Disp: 540 capsule, Rfl:  0   Vital signs in last 24 hrs: Vitals:   07/03/20 1532  BP: 128/86  Pulse: (!) 107  SpO2: 97%    Physical Exam  Well-appearing  HEENT: sclera anicteric, oral mucosa moist without lesions  Neck: supple, no thyromegaly, JVD or lymphadenopathy  Cardiac: RRR without murmurs, S1S2 heard, no peripheral edema  Pulm: clear to auscultation bilaterally, normal RR and effort noted  Abdomen: soft, no tenderness, with active bowel sounds. No guarding or palpable hepatosplenomegaly.  Labs:  CBC Latest Ref Rng & Units 05/07/2020 05/22/2015  WBC 4.0 - 10.5 K/uL 10.0 10.0  Hemoglobin 13.0 - 17.0 g/dL 15.9 15.3  Hematocrit 39 - 52 % 46.5 44.5  Platelets 150 - 400 K/uL 187.0 217   CMP Latest Ref Rng & Units 05/07/2020 05/22/2015  Glucose 70 - 99 mg/dL 97 111(H)  BUN 6 - 23 mg/dL 10 14  Creatinine 0.40 - 1.50 mg/dL 1.15 1.23  Sodium 135 - 145 mEq/L 139 140  Potassium 3.5 - 5.1 mEq/L 4.4 3.7  Chloride 96 - 112 mEq/L 106 108  CO2 19 - 32 mEq/L 30 23  Calcium 8.4 - 10.5 mg/dL 9.7 9.3  Total Protein 6.0 - 8.3 g/dL 7.2 -  Total Bilirubin 0.2 - 1.2 mg/dL 0.8 -  Alkaline Phos 39 - 117 U/L 94 -  AST 0 - 37 U/L 19 -  ALT 0 - 53 U/L 32 -    ___________________________________________ Radiologic studies:  ____________________________________________ Other:  Stool studies negative for C. difficile and ova and parasites  Duodenal, gastric and colon biopsies normal.  Diminutive ascending colon flat polyp was a tubular adenoma _____________________________________________ Assessment & Plan  Assessment: Encounter Diagnoses  Name Primary?  . Irritable bowel syndrome with both constipation and diarrhea Yes  . Abdominal bloating   . Adenomatous polyp of ascending colon    Abdominal pain, bloating and altered bowel habits, IBS with significant dietary triggers.  Given how much better he feels, it does not seem that he needs the dicyclomine at this point. Plan: He was given some  additional written dietary advice and my card so he could call as needed.  Surveillance colonoscopy in 7 years.   20 minutes were spent on this encounter (including chart review, history/exam, counseling/coordination of care, and documentation)  Nelida Meuse III

## 2021-11-22 ENCOUNTER — Other Ambulatory Visit: Payer: Self-pay

## 2021-11-22 DIAGNOSIS — R509 Fever, unspecified: Secondary | ICD-10-CM | POA: Diagnosis not present

## 2021-11-22 DIAGNOSIS — N5089 Other specified disorders of the male genital organs: Secondary | ICD-10-CM | POA: Insufficient documentation

## 2021-11-22 DIAGNOSIS — M791 Myalgia, unspecified site: Secondary | ICD-10-CM | POA: Insufficient documentation

## 2021-11-22 NOTE — ED Triage Notes (Signed)
Fever x 2 days, genital breakout and thinks significant other gave him STD.

## 2021-11-23 ENCOUNTER — Emergency Department (HOSPITAL_BASED_OUTPATIENT_CLINIC_OR_DEPARTMENT_OTHER)
Admission: EM | Admit: 2021-11-23 | Discharge: 2021-11-23 | Disposition: A | Discharge status: Home / Self Care | Payer: No Typology Code available for payment source | Attending: Emergency Medicine | Admitting: Emergency Medicine

## 2021-11-23 DIAGNOSIS — R3989 Other symptoms and signs involving the genitourinary system: Secondary | ICD-10-CM

## 2021-11-23 DIAGNOSIS — R509 Fever, unspecified: Secondary | ICD-10-CM

## 2021-11-23 DIAGNOSIS — M791 Myalgia, unspecified site: Secondary | ICD-10-CM

## 2021-11-23 LAB — HIV ANTIBODY (ROUTINE TESTING W REFLEX): HIV Screen 4th Generation wRfx: NONREACTIVE

## 2021-11-23 LAB — RAPID HIV SCREEN (HIV 1/2 AB+AG)
HIV 1/2 Antibodies: NONREACTIVE
HIV-1 P24 Antigen - HIV24: NONREACTIVE

## 2021-11-23 MED ORDER — VALACYCLOVIR HCL 1 G PO TABS: 1000.0000 mg | ORAL_TABLET | Freq: Two times a day (BID) | ORAL | 0 refills | Status: AC

## 2021-11-23 MED ORDER — ACETAMINOPHEN 500 MG PO TABS
1000.0000 mg | ORAL_TABLET | Freq: Once | ORAL | Status: AC
  Administered 2021-11-23: 1000 mg via ORAL
  Filled 2021-11-23: qty 2

## 2021-11-23 NOTE — ED Provider Notes (Signed)
[facility] DEPT Provider Note  CSN: [id number] Arrival date & time: 11/22/21 2213  Chief Complaint(s) Fever  HPI Steven Fuentes is a 36 y.o. male here for 2 days of fever, chills and myalgias.  Patient is concerned he has HSV as he has developed genital rash over the past week.  Patient is sexually active with 1 partner.  Has unprotected sex.  Denies any penile discharge.  No dysuria.  Denies any cough or congestion.  No shortness of breath.  No abdominal pain, nausea, or vomiting.  HPI  Past Medical History No past medical history on file. There are no problems to display for this patient.  Home Medication(s) Prior to Admission medications   Medication Sig Start Date End Date Taking? Authorizing Provider  valACYclovir (VALTREX) 1000 MG tablet Take 1 tablet (1,000 mg total) by mouth 2 (two) times daily for 10 days. 11/23/21 12/03/21 Yes Kingsten Enfield, Grayce Sessions, MD  acetaminophen (TYLENOL) 500 MG tablet Take 500 mg by mouth as needed.     [provider]  bismuth subsalicylate (PEPTO BISMOL) 262 MG/15ML suspension Take 30 mLs by mouth every 6 (six) hours as needed.     [provider]  cyclobenzaprine (FLEXERIL) 10 MG tablet Take 10 mg by mouth as needed.  10/18/15   [provider]  dicyclomine (BENTYL) 10 MG capsule Take 2 capsules (20 mg total) by mouth 3 (three) times daily before meals. Patient not taking: Reported on 07/03/2020 06/26/20   Doran Stabler, MD  naproxen (NAPROSYN) 500 MG tablet Take 1 tablet (500 mg total) by mouth every 12 (twelve) hours as needed for mild pain or moderate pain. 05/28/19   Antonietta Breach, PA-C  pregabalin (LYRICA) 100 MG capsule Take 100 mg by mouth as needed.  10/18/15   [provider]                                                                                                                                    Allergies Patient has no known allergies.  Review of Systems Review of  Systems As noted in HPI  Physical Exam Vital Signs  I have reviewed the triage vital signs BP (!) 113/95    Pulse (!) 109    Temp (!) 100.8 F (38.2 C) (Oral)    Resp 18    Ht 6\' 4"  (1.93 m)    Wt 117.9 kg    SpO2 96%    BMI 31.65 kg/m   Physical Exam Vitals reviewed.  Constitutional:      General: He is not in acute distress.    Appearance: He is well-developed. He is not diaphoretic.  HENT:     Head: Normocephalic and atraumatic.     Right Ear: External ear normal.     Left Ear: External ear normal.     Nose: Nose normal.     Mouth/Throat:     Mouth: Mucous  membranes are moist.  Eyes:     General: No scleral icterus.    Conjunctiva/sclera: Conjunctivae normal.  Neck:     Trachea: Phonation normal.  Cardiovascular:     Rate and Rhythm: Normal rate and regular rhythm.  Pulmonary:     Effort: Pulmonary effort is normal. No respiratory distress.     Breath sounds: No stridor.  Abdominal:     General: There is no distension.  Genitourinary:    Pubic Area: Rash present.     Penis: Lesions present. No tenderness, discharge or swelling.      Testes:        Right: Tenderness not present.        Left: Tenderness not present.    Musculoskeletal:        General: Normal range of motion.     Cervical back: Normal range of motion.  Neurological:     Mental Status: He is alert and oriented to person, place, and time.  Psychiatric:        Behavior: Behavior normal.    ED Results and Treatments Labs (all labs ordered are listed, but only abnormal results are displayed) Labs Reviewed  RAPID HIV SCREEN (HIV 1/2 AB+AG)  HERPES SIMPLEX VIRUS(HSV) DNA BY PCR  HIV ANTIBODY (ROUTINE TESTING W REFLEX)  GC/CHLAMYDIA PROBE AMP ([place]) NOT AT [facility]                                                                                                                         EKG  EKG Interpretation  Date/Time:    Ventricular Rate:    PR Interval:    QRS Duration:   QT Interval:     QTC Calculation:   R Axis:     Text Interpretation:         Radiology No results found.  Pertinent labs & imaging results that were available during my care of the patient were reviewed by me and considered in my medical decision making (see MDM for details).  Medications Ordered in ED Medications  acetaminophen (TYLENOL) tablet 1,000 mg (1,000 mg Oral Given 11/23/21 0121)                                                                                                                                     Procedures Procedures  (including critical care time)  Medical Decision Making / ED Course  General rash with fever and myalgias Most suspicious for HSV infection.  Less likely cellulitic process.  Patient is denying any other infectious symptoms.  We will obtain HSV PCR, GC/chlamydia urine, rapid HIV and RPR.  Management: Patient given Tylenol for fever myalgias. We will treat with valacyclovir.  Final Clinical Impression(s) / ED Diagnoses Final diagnoses:  Genital sore  Fever in adult  Myalgia   The patient appears reasonably screened and/or stabilized for discharge and I doubt any other medical condition or other [facility] requiring further screening, evaluation, or treatment in the ED at this time prior to discharge. Safe for discharge with strict return precautions.  Disposition: Discharge  Condition: Good  I have discussed the results, Dx and Tx plan with the patient/family who expressed understanding and agree(s) with the plan. Discharge instructions discussed at length. The patient/family was given strict return precautions who verbalized understanding of the instructions. No further questions at time of discharge.    ED Discharge Orders          Ordered    valACYclovir (VALTREX) 1000 MG tablet  2 times daily        11/23/21 0125             Follow Up: [facility] [place]  [id number] [phone]  Call  to schedule an appointment for close follow up           This chart was dictated using voice recognition software.  Despite best efforts to proofread,  errors can occur which can change the documentation meaning.    Fatima Blank, MD 11/23/21 [phone]

## 2021-11-23 NOTE — ED Notes (Signed)
Pt verbalizes understanding of discharge instructions. Opportunity for questioning and answers were provided. Pt discharged from ED to home.   ? ?

## 2021-11-24 LAB — GC/CHLAMYDIA PROBE AMP (~~LOC~~) NOT AT ARMC
Chlamydia: NEGATIVE
Comment: NEGATIVE
Comment: NORMAL
Neisseria Gonorrhea: NEGATIVE

## 2021-11-26 LAB — HSV DNA BY PCR (REFERENCE LAB)
HSV 1 DNA: NEGATIVE
HSV 2 DNA: POSITIVE — AB

## 2021-11-27 ENCOUNTER — Telehealth (HOSPITAL_COMMUNITY): Payer: Self-pay

## 2023-01-24 ENCOUNTER — Other Ambulatory Visit: Payer: Self-pay

## 2023-01-24 ENCOUNTER — Encounter (HOSPITAL_COMMUNITY): Payer: Self-pay | Admitting: *Deleted

## 2023-01-24 ENCOUNTER — Emergency Department (HOSPITAL_COMMUNITY)
Admission: EM | Admit: 2023-01-24 | Discharge: 2023-01-24 | Discharge status: Against Medical Advice | Payer: No Typology Code available for payment source | Attending: Emergency Medicine | Admitting: Emergency Medicine

## 2023-01-24 DIAGNOSIS — Z5321 Procedure and treatment not carried out due to patient leaving prior to being seen by health care provider: Secondary | ICD-10-CM | POA: Diagnosis not present

## 2023-01-24 DIAGNOSIS — S61210A Laceration without foreign body of right index finger without damage to nail, initial encounter: Secondary | ICD-10-CM | POA: Diagnosis present

## 2023-01-24 DIAGNOSIS — S61212A Laceration without foreign body of right middle finger without damage to nail, initial encounter: Secondary | ICD-10-CM | POA: Diagnosis not present

## 2023-01-24 DIAGNOSIS — W260XXA Contact with knife, initial encounter: Secondary | ICD-10-CM | POA: Insufficient documentation

## 2023-01-24 NOTE — ED Provider Triage Note (Signed)
Emergency Medicine Provider Triage Evaluation Note  Steven Fuentes , a 36 y.o. male  was evaluated in triage.  Patient complains of laceration to the right index and middle finger.  Was cutting a knife.  Last tetanus within the last 3 years.  2 cm very superficial and likely Dermabond appropriate laceration to the dorsal surface of the middle finger.  Abrasion and small cut to the tip of the right finger  Review of Systems  Positive:  Negative:   Physical Exam  BP (!) 131/91 (BP Location: Right Arm)   Pulse 67   Temp 98.3 F (36.8 C) (Oral)   Resp 18   Ht  (1.93 m)   Wt 117.9 kg   SpO2 97%   BMI 31.64 kg/m  Gen:   Awake, no distress   Resp:  Normal effort  MSK:   Moves extremities without difficulty  Other:    Medical Decision Making  Medically screening exam initiated at 8:09 PM.  Appropriate orders placed.  Steven Fuentes was informed that the remainder of the evaluation will be completed by another provider, this initial triage assessment does not replace that evaluation, and the importance of remaining in the ED until their evaluation is complete.     Saddie Benders, PA-C 01/24/23 2011

## 2023-01-24 NOTE — ED Triage Notes (Signed)
Laceration to the  lt middle finger with a sharp knife  the cur has stopped bleeding at present the laceration edges are  flat with the bamdage

## 2023-01-24 NOTE — ED Notes (Addendum)
Pt said his fingers are wrapped, no bleeding and he's going somewhere else to get his stitches because he doesn't want to possibly wait 3 to 4 hours.
